# Patient Record
Sex: Male | Born: 1980 | Race: White | Hispanic: No | Smoking: Current every day smoker
Health system: Southern US, Community
[De-identification: ages and names within clinical notes are randomized; demographics above are authoritative.]

## PROBLEM LIST (undated history)

## (undated) DIAGNOSIS — F419 Anxiety disorder, unspecified: Secondary | ICD-10-CM

## (undated) HISTORY — PX: RHINOPLASTY: SUR1284

---

## 2010-11-15 ENCOUNTER — Emergency Department (HOSPITAL_COMMUNITY): Payer: BC Managed Care – PPO

## 2010-11-15 ENCOUNTER — Emergency Department (HOSPITAL_COMMUNITY)
Admission: EM | Admit: 2010-11-15 | Discharge: 2010-11-16 | Disposition: A | Discharge status: Home / Self Care | Payer: BC Managed Care – PPO | Attending: Emergency Medicine | Admitting: Emergency Medicine

## 2010-11-15 DIAGNOSIS — M545 Low back pain, unspecified: Secondary | ICD-10-CM | POA: Insufficient documentation

## 2010-11-15 DIAGNOSIS — R109 Unspecified abdominal pain: Secondary | ICD-10-CM | POA: Insufficient documentation

## 2010-11-15 LAB — CBC
HCT: 43.8 % (ref 39.0–52.0)
Hemoglobin: 15.5 g/dL (ref 13.0–17.0)
MCHC: 35.4 g/dL (ref 30.0–36.0)
RBC: 4.64 MIL/uL (ref 4.22–5.81)

## 2010-11-15 LAB — POCT I-STAT, CHEM 8
Chloride: 102 mEq/L (ref 96–112)
Glucose, Bld: 96 mg/dL (ref 70–99)
HCT: 47 % (ref 39.0–52.0)
Potassium: 3.6 mEq/L (ref 3.5–5.1)
Sodium: 138 mEq/L (ref 135–145)

## 2010-11-16 LAB — URINALYSIS, ROUTINE W REFLEX MICROSCOPIC
Bilirubin Urine: NEGATIVE
Glucose, UA: NEGATIVE mg/dL
Hgb urine dipstick: NEGATIVE
Ketones, ur: NEGATIVE mg/dL
Protein, ur: NEGATIVE mg/dL
pH: 6 (ref 5.0–8.0)

## 2010-11-16 LAB — HEPATIC FUNCTION PANEL
ALT: 24 U/L (ref 0–53)
AST: 26 U/L (ref 0–37)
Albumin: 3.9 g/dL (ref 3.5–5.2)
Alkaline Phosphatase: 142 U/L — ABNORMAL HIGH (ref 39–117)
Total Bilirubin: 0.2 mg/dL — ABNORMAL LOW (ref 0.3–1.2)
Total Protein: 6.8 g/dL (ref 6.0–8.3)

## 2012-03-15 IMAGING — CT CT ABD-PELV W/O CM
2 of 4 series · 17 of 46 positions shown, 19 images · non-contrast
Comparison: None.

CLINICAL DATA: Right flank pain and right lower quadrant pain.

CT ABDOMEN AND PELVIS WITHOUT CONTRAST
TECHNIQUE: Multidetector CT imaging of the abdomen and pelvis was
performed following the standard protocol without intravenous
contrast.

[Series 2: a/p w/o 5.0 b31f st · axial · non-contrast · 0.83mm/px · z∈[-490,-70]mm · 14 of 92 slices shown, 16 images]
[im 4/92  soft-tissue]
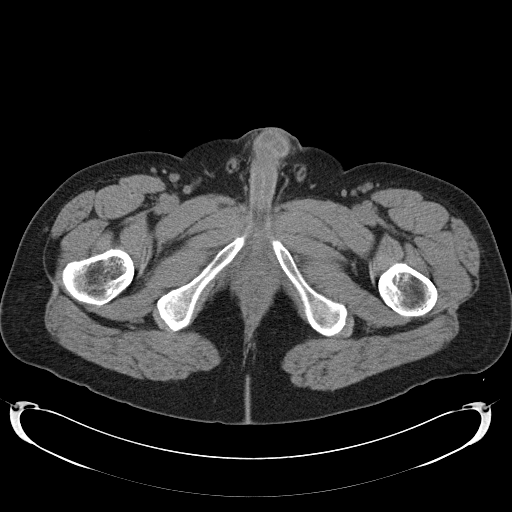
[im 4/92  bone]
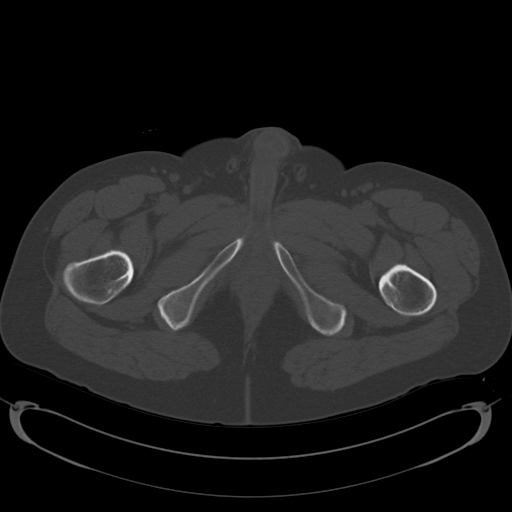
[im 12/92  soft-tissue]
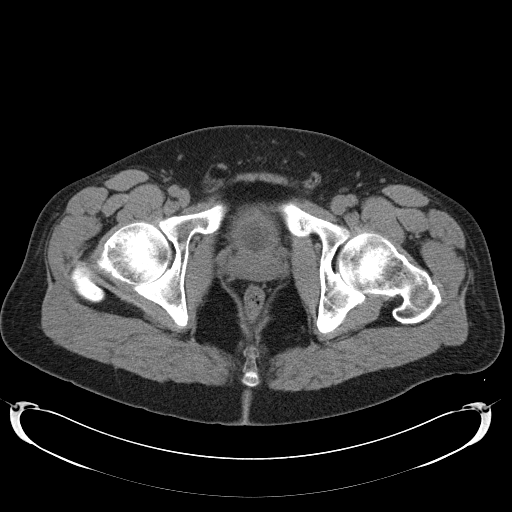
[im 19/92  soft-tissue]
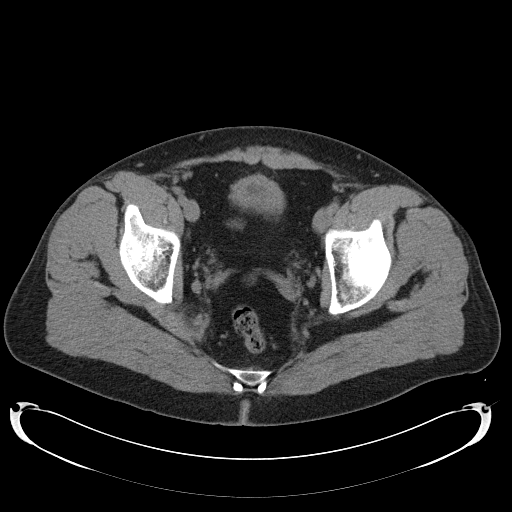
[im 23/92  soft-tissue]
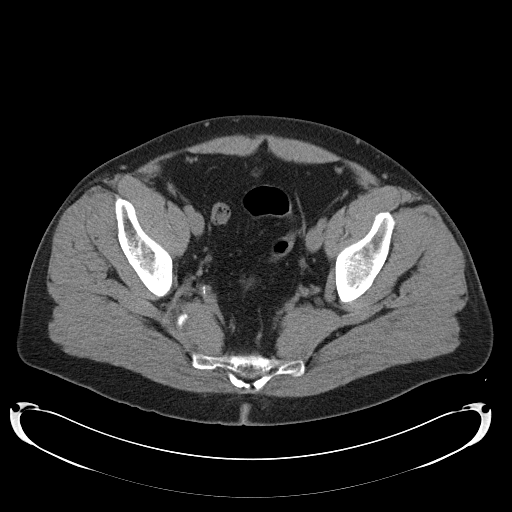
[im 31/92  soft-tissue]
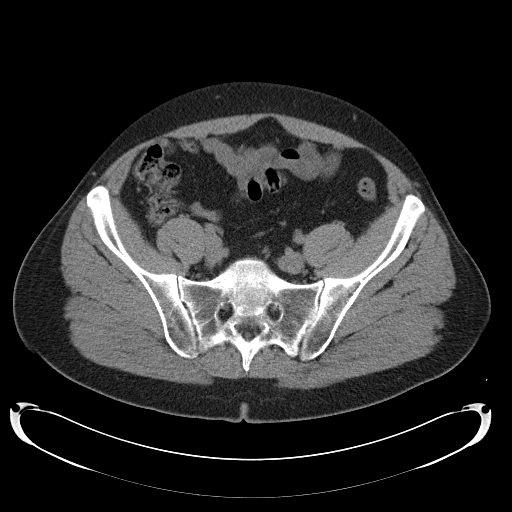
[im 38/92  soft-tissue]
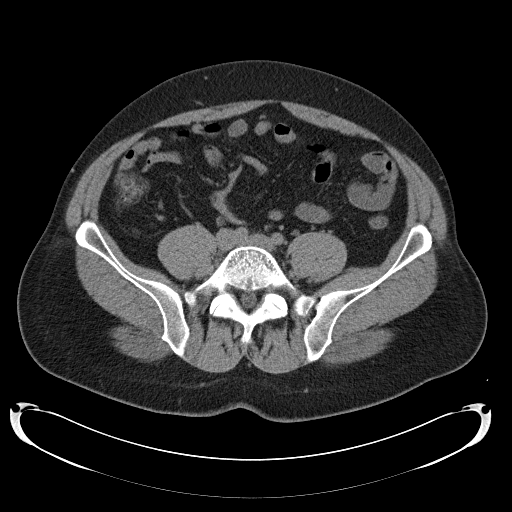
[im 42/92  soft-tissue]
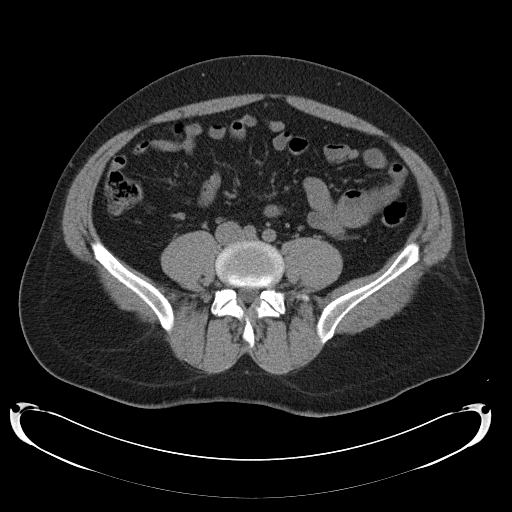
[im 50/92  soft-tissue]
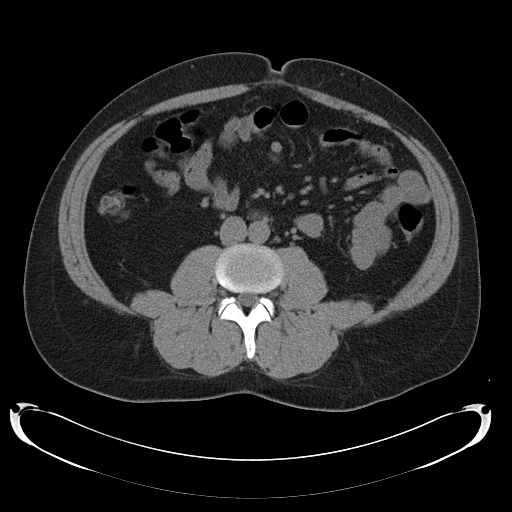
[im 54/92  soft-tissue]
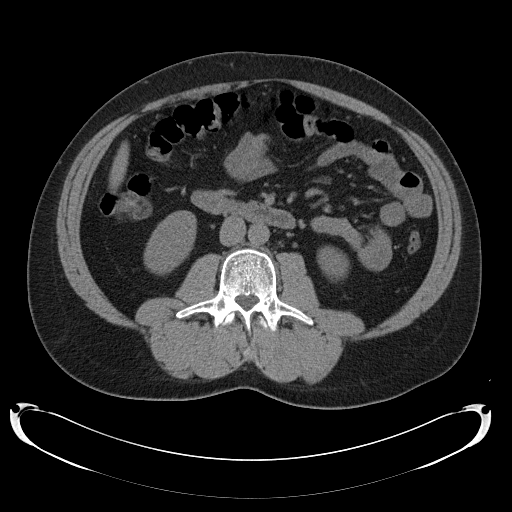
[im 54/92  bone]
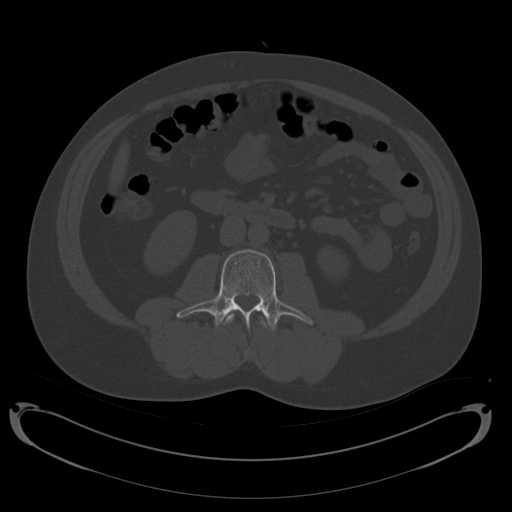
[im 61/92  soft-tissue]
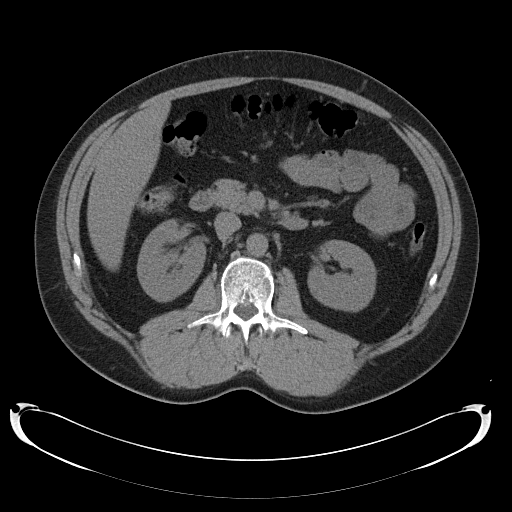
[im 69/92  soft-tissue]
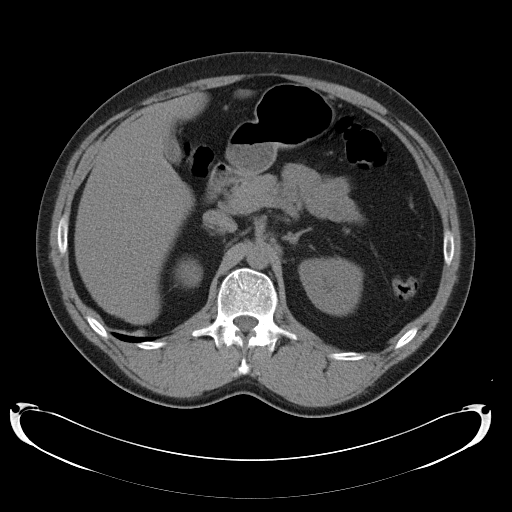
[im 73/92  soft-tissue]
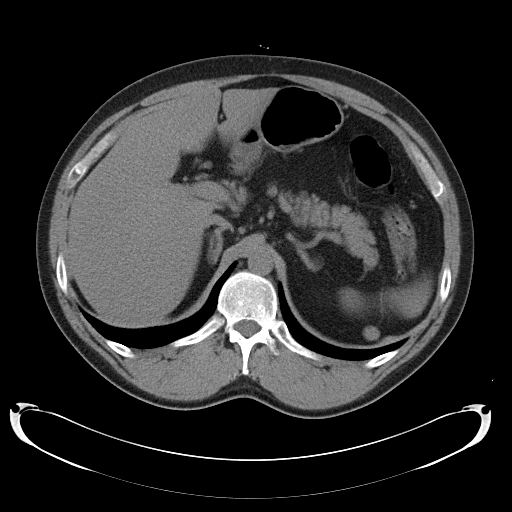
[im 80/92  soft-tissue]
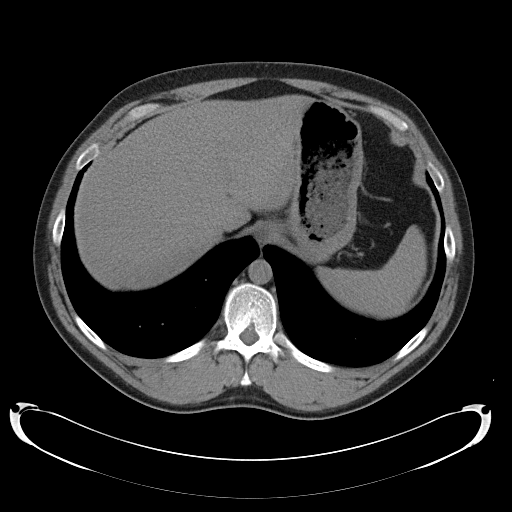
[im 88/92  soft-tissue]
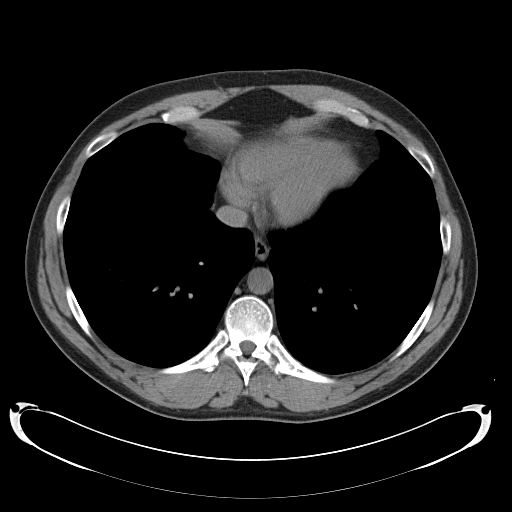

[Series 602: cor · coronal · 0.90mm/px · 3 of 133 slices shown]
[im 45/133  soft-tissue]
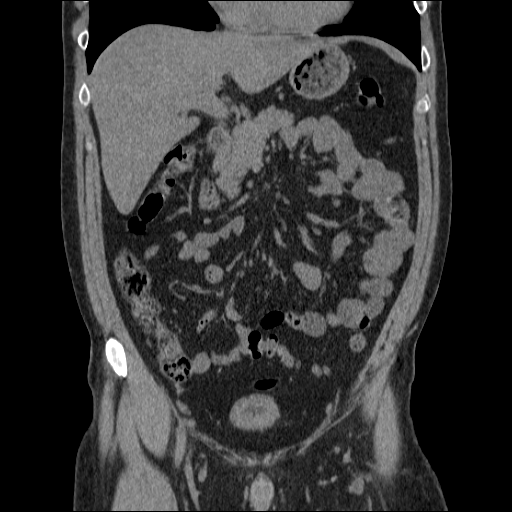
[im 59/133  soft-tissue]
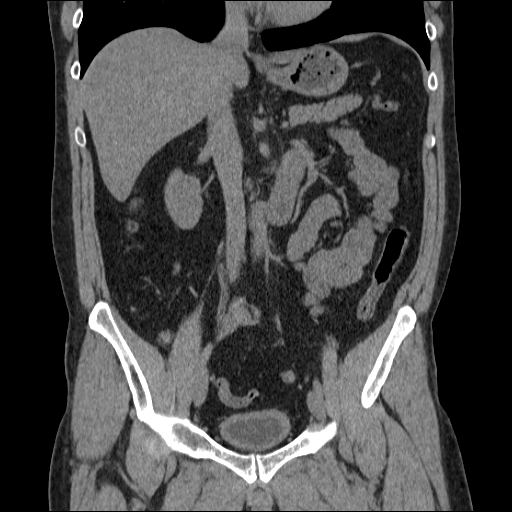
[im 74/133  soft-tissue]
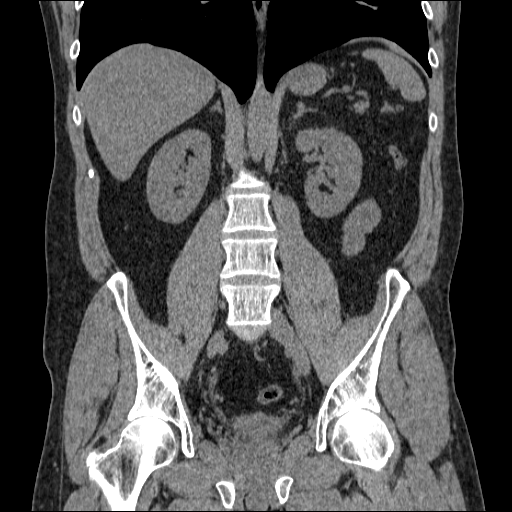

[17 of 46 positions shown; findings below may reference images not displayed]

FINDINGS: The lung bases are clear.  Small accessory spleen.  The
kidneys appear symmetrical.  No renal, ureteral, or bladder stones
demonstrated.  No pyelocaliectasis or ureterectasis.  The bladder
wall demonstrates mild diffuse thickening which might be due to
incomplete distension although bladder hypertrophy or infection are
not excluded.

Unenhanced liver, spleen, gallbladder, pancreas, adrenal glands,
abdominal aorta, and retroperitoneal lymph nodes are unremarkable.
The stomach and small bowel are decompressed.  No free air or free
fluid in the abdomen.

Pelvis:  The appendix is normal.  The prostate gland is not
enlarged.  No free or loculated pelvic fluid collections.  The
colon is decompressed without wall thickening or inflammatory
change.  Normal alignment of the lumbar vertebrae.
IMPRESSION: No renal or ureteral stone or obstruction demonstrated.  Diffuse
bladder wall thickening is nonspecific and might be due to under
distension although bladder hypertrophy or infection can also have
this appearance.

## 2013-03-06 ENCOUNTER — Encounter (HOSPITAL_COMMUNITY): Payer: Self-pay | Admitting: Emergency Medicine

## 2013-03-06 ENCOUNTER — Emergency Department (HOSPITAL_COMMUNITY)
Admission: EM | Admit: 2013-03-06 | Discharge: 2013-03-06 | Disposition: A | Discharge status: Home / Self Care | Payer: BC Managed Care – PPO | Attending: Emergency Medicine | Admitting: Emergency Medicine

## 2013-03-06 ENCOUNTER — Emergency Department (HOSPITAL_COMMUNITY): Payer: BC Managed Care – PPO

## 2013-03-06 DIAGNOSIS — Z7982 Long term (current) use of aspirin: Secondary | ICD-10-CM | POA: Insufficient documentation

## 2013-03-06 DIAGNOSIS — R0602 Shortness of breath: Secondary | ICD-10-CM | POA: Insufficient documentation

## 2013-03-06 DIAGNOSIS — Z8659 Personal history of other mental and behavioral disorders: Secondary | ICD-10-CM | POA: Insufficient documentation

## 2013-03-06 DIAGNOSIS — M25519 Pain in unspecified shoulder: Secondary | ICD-10-CM | POA: Insufficient documentation

## 2013-03-06 DIAGNOSIS — R079 Chest pain, unspecified: Secondary | ICD-10-CM

## 2013-03-06 DIAGNOSIS — F17200 Nicotine dependence, unspecified, uncomplicated: Secondary | ICD-10-CM | POA: Insufficient documentation

## 2013-03-06 DIAGNOSIS — R0789 Other chest pain: Secondary | ICD-10-CM | POA: Insufficient documentation

## 2013-03-06 HISTORY — DX: Anxiety disorder, unspecified: F41.9

## 2013-03-06 LAB — BASIC METABOLIC PANEL
BUN: 8 mg/dL (ref 6–23)
CO2: 28 mEq/L (ref 19–32)
Calcium: 10 mg/dL (ref 8.4–10.5)
Chloride: 96 mEq/L (ref 96–112)
Creatinine, Ser: 0.79 mg/dL (ref 0.50–1.35)
GFR calc Af Amer: >90 mL/min (ref >90)
GFR calc non Af Amer: >90 mL/min (ref >90)
Glucose, Bld: 91 mg/dL (ref 70–99)
Potassium: 4.2 mEq/L (ref 3.7–5.3)
Sodium: 138 mEq/L (ref 137–147)

## 2013-03-06 LAB — CBC
HCT: 48.3 % (ref 39.0–52.0)
Hemoglobin: 16.5 g/dL (ref 13.0–17.0)
MCH: 32.1 pg (ref 26.0–34.0)
MCHC: 34.2 g/dL (ref 30.0–36.0)
MCV: 94 fL (ref 78.0–100.0)
Platelets: 237 10*3/uL (ref 150–400)
RBC: 5.14 MIL/uL (ref 4.22–5.81)
RDW: 12.6 % (ref 11.5–15.5)
WBC: 7.3 10*3/uL (ref 4.0–10.5)

## 2013-03-06 LAB — POCT I-STAT TROPONIN I
Troponin i, poc: 0 ng/mL (ref 0.00–0.08)
Troponin i, poc: 0.01 ng/mL (ref 0.00–0.08)

## 2013-03-06 LAB — PRO B NATRIURETIC PEPTIDE: Pro B Natriuretic peptide (BNP): 20.6 pg/mL (ref 0–125)

## 2013-03-06 NOTE — ED Notes (Signed)
Pt transported to xray 

## 2013-03-06 NOTE — ED Provider Notes (Signed)
CSN: [id number]     Arrival date & time 03/06/13  1529 History   First MD Initiated Contact with Patient 03/06/13 1614     Chief Complaint  Patient presents with  . Chest Pain  . Shortness of Breath     (Consider location/radiation/quality/duration/timing/severity/associated sxs/prior Treatment) HPI Brent Collier is a 33 y.o. male who presents emergency department complaining of chest pain or shortness of breath. Patient states his symptoms began approximately 3 days ago. States he develops pressure in the left upper chest and left shoulder, that radiates into the left arm. She states she has mild associated shortness of breath. States this has been happening daily for the last 3 days, lasting anywhere from a few minutes to a few hours at a time. He states he just got promoted on his job a week ago. Prior to that his job is very physical, and states he has never had any similar symptoms. He states now he stopped exercising and doing any type of physical activity and thinks this is related to that. He admits to similar symptoms 5 years ago, states went to urgent care and he told him he had anxiety. At that time they started him on Xanax. Patient states he has been off of Xanax for approximately year and a half. He states he currently does not have a primary care Dr. He denies any prior creatinine history. He denies any family history of heart problems. He does not know his cholesterol, he is a smoker. He does not take any medications daily. He denies any other associated symptoms, denies any fever, chills, cough. He's currently symptom-free.  Past Medical History  Diagnosis Date  . Anxiety    Past Surgical History  Procedure Laterality Date  . Rhinoplasty     History reviewed. No pertinent family history. History  Substance Use Topics  . Smoking status: Current Every Day Smoker -- 0.50 packs/day  . Smokeless tobacco: Not on file  . Alcohol Use: 12.6 oz/week    21 Cans of beer per week     Review of Systems  Constitutional: Negative for fever and chills.  Respiratory: Positive for chest tightness and shortness of breath. Negative for cough.   Cardiovascular: Positive for chest pain. Negative for palpitations and leg swelling.  Gastrointestinal: Negative for nausea, vomiting, abdominal pain, diarrhea and abdominal distention.  Genitourinary: Negative for dysuria, urgency, frequency and hematuria.  Musculoskeletal: Negative for arthralgias, myalgias, neck pain and neck stiffness.  Skin: Negative for rash.  Allergic/Immunologic: Negative for immunocompromised state.  Neurological: Negative for dizziness, weakness, light-headedness, numbness and headaches.  All other systems reviewed and are negative.      Allergies  Bee venom  Home Medications   Current Outpatient Rx  Name  Route  Sig  Dispense  Refill  . aspirin EC 325 MG tablet   Oral   Take 325 mg by mouth daily as needed for mild pain (some chest pain).          BP 138/59  Pulse 76  Temp(Src) 98.3 F (36.8 C) (Oral)  Resp 14  SpO2 99% Physical Exam  Nursing note and vitals reviewed. Constitutional: He is oriented to person, place, and time. He appears well-developed and well-nourished. No distress.  Eyes: Conjunctivae are normal.  Cardiovascular: Normal rate, regular rhythm and normal heart sounds.   Pulmonary/Chest: Effort normal and breath sounds normal. No respiratory distress. He has no wheezes. He has no rales. He exhibits no tenderness.  Musculoskeletal: He exhibits no edema.  Neurological: He is alert and oriented to person, place, and time.  Skin: Skin is warm and dry.    ED Course  Procedures (including critical care time) Labs Review Labs Reviewed  CBC  BASIC METABOLIC PANEL  PRO B NATRIURETIC PEPTIDE  POCT I-STAT TROPONIN I  POCT I-STAT TROPONIN I   Imaging Review Dg Chest 2 View  03/06/2013   CLINICAL DATA:  Chest pain, shortness of breath  EXAM: CHEST  2 VIEW  COMPARISON:   None.  FINDINGS: The heart size and mediastinal contours are within normal limits. Both lungs are clear. The visualized skeletal structures are unremarkable.  IMPRESSION: No active cardiopulmonary disease.   Electronically Signed   By: Ruel Favors M.D.   On: 03/06/2013 17:04    EKG Interpretation   None       MDM   Final diagnoses:  Chest pain    Patient is with no risk factors for coronary artery disease, he is young, no medical problems, here with intermittent left-sided chest pain that is nonexertional. He is currently symptom-free. History of the same, diagnosed with anxiety. He has not taken increased anxiety at this time. We'll get labs, troponin, EKG, chest x-ray. Will monitor.   Patient's labs are all unremarkable, including delta troponin. He continues to be symptoms free. I discussed this with patient. He is anxious and concerned and wants to see a cardiologist outpatient. Referral provided. Also will need to find a PCP. Pt is to return if worsening symptoms.   Filed Vitals:   03/06/13 1541 03/06/13 1639 03/06/13 1810 03/06/13 1950  BP: 151/82 138/59 141/73 124/57  Pulse: 76   67  Temp: 98.3 F (36.8 C)   98 F (36.7 C)  TempSrc: Oral   Oral  Resp:  14 20 16   SpO2: 100% 99%  98%      Slevin Gunby A Senna Lape, PA-C 03/06/13 2257

## 2013-03-06 NOTE — Discharge Instructions (Signed)
Please follow up with a primary care doctor if symptoms continue. You can also follow up with cardiology.    Chest Pain (Nonspecific) It is often hard to give a specific diagnosis for the cause of chest pain. There is always a chance that your pain could be related to something serious, such as a heart attack or a blood clot in the lungs. You need to follow up with your caregiver for further evaluation. CAUSES   Heartburn.  Pneumonia or bronchitis.  Anxiety or stress.  Inflammation around your heart (pericarditis) or lung (pleuritis or pleurisy).  A blood clot in the lung.  A collapsed lung (pneumothorax). It can develop suddenly on its own (spontaneous pneumothorax) or from injury (trauma) to the chest.  Shingles infection (herpes zoster virus). The chest wall is composed of bones, muscles, and cartilage. Any of these can be the source of the pain.  The bones can be bruised by injury.  The muscles or cartilage can be strained by coughing or overwork.  The cartilage can be affected by inflammation and become sore (costochondritis). DIAGNOSIS  Lab tests or other studies, such as X-rays, electrocardiography, stress testing, or cardiac imaging, may be needed to find the cause of your pain.  TREATMENT   Treatment depends on what may be causing your chest pain. Treatment may include:  Acid blockers for heartburn.  Anti-inflammatory medicine.  Pain medicine for inflammatory conditions.  Antibiotics if an infection is present.  You may be advised to change lifestyle habits. This includes stopping smoking and avoiding alcohol, caffeine, and chocolate.  You may be advised to keep your head raised (elevated) when sleeping. This reduces the chance of acid going backward from your stomach into your esophagus.  Most of the time, nonspecific chest pain will improve within 2 to 3 days with rest and mild pain medicine. HOME CARE INSTRUCTIONS   If antibiotics were prescribed, take your  antibiotics as directed. Finish them even if you start to feel better.  For the next few days, avoid physical activities that bring on chest pain. Continue physical activities as directed.  Do not smoke.  Avoid drinking alcohol.  Only take over-the-counter or prescription medicine for pain, discomfort, or fever as directed by your caregiver.  Follow your caregiver's suggestions for further testing if your chest pain does not go away.  Keep any follow-up appointments you made. If you do not go to an appointment, you could develop lasting (chronic) problems with pain. If there is any problem keeping an appointment, you must call to reschedule. SEEK MEDICAL CARE IF:   You think you are having problems from the medicine you are taking. Read your medicine instructions carefully.  Your chest pain does not go away, even after treatment.  You develop a rash with blisters on your chest. SEEK IMMEDIATE MEDICAL CARE IF:   You have increased chest pain or pain that spreads to your arm, neck, jaw, back, or abdomen.  You develop shortness of breath, an increasing cough, or you are coughing up blood.  You have severe back or abdominal pain, feel nauseous, or vomit.  You develop severe weakness, fainting, or chills.  You have a fever. THIS IS AN EMERGENCY. Do not wait to see if the pain will go away. Get medical help at once. Call your local emergency services (911 in U.S.). Do not drive yourself to the hospital. MAKE SURE YOU:   Understand these instructions.  Will watch your condition.  Will get help right away if you  are not doing well or get worse. Document Released: 10/15/2004 Document Revised: 03/30/2011 Document Reviewed: 08/11/2007 [facility] Patient Information 2014 [place], [place].   Emergency Department Resource Guide 1) Find a Doctor and Pay Out of Pocket Although you won't have to find out who is covered by your insurance plan, it is a good idea to ask around and get  recommendations. You will then need to call the office and see if the doctor you have chosen will accept you as a new patient and what types of options they offer for patients who are self-pay. Some doctors offer discounts or will set up payment plans for their patients who do not have insurance, but you will need to ask so you aren't surprised when you get to your appointment.  2) Contact Your Local Health Department Not all health departments have doctors that can see patients for sick visits, but many do, so it is worth a call to see if yours does. If you don't know where your local health department is, you can check in your phone book. The CDC also has a tool to help you locate your state's health department, and many state websites also have listings of all of their local health departments.  3) Find a Walk-in Clinic If your illness is not likely to be very severe or complicated, you may want to try a walk in clinic. These are popping up all over the country in pharmacies, drugstores, and shopping centers. They're usually staffed by nurse practitioners or physician assistants that have been trained to treat common illnesses and complaints. They're usually fairly quick and inexpensive. However, if you have serious medical issues or chronic medical problems, these are probably not your best option.  No Primary Care Doctor: - Call Health Connect at  [phone] - they can help you locate a primary care doctor that  accepts your insurance, provides certain services, etc. - Physician Referral Service- [phone]  Chronic Pain Problems: Organization         Address  Phone   Notes  [facility]  [phone] Patients need to be referred by their primary care doctor.   Medication Assistance: Organization         Address  Phone   Notes  [facility] Medication [facility] [address] [place] [place], [place] [address] [phone] --Must be a resident of  [facility] -- Must have NO insurance coverage whatsoever (no Medicaid/ Medicare, etc.) -- The pt. MUST have a primary care doctor that directs their care regularly and follows them in the community   [facility]ist  [phone]   [employer]  [phone]    Agencies that provide inexpensive medical care: Organization         Address  Phone   Notes  [facility]  [phone]   [facility]    [phone]   [facility] [address] [place], [place] [address] [phone]   [facility] [address] [place] [place] [phone]   Planned Parenthood    [phone]   [facility]    [phone]   Community Health and [facility]  [address], [place] Phone:  [phone], Fax:  [phone] Hours of Operation:  9 am - 6 pm, M-F.  Also accepts Medicaid/Medicare and self-pay.  [facility]  [address], [place] Phone: [phone])  [phone], Fax: [phone]. Hours of Operation:  8:30 am - 5:30 pm, M-F.  Also accepts Medicaid and self-pay.  [facility] [place] [address], [place] Phone: [phone]) [phone]   [facility] [address], [place], [place] [phone], Ext. [phone] Mondays & Thursdays: 7-9 AM.  First 15 patients are seen on a first come, first serve basis.    Medicaid-accepting [facility] Providers:  Organization         Address  Phone   Notes  [facility] [address], [place] [phone] Also accepts self-pay patients.  [facility] [address], [place]  [phone]   [facility] [address], [place] [phone]   [facility] [address] [place] [address], [place] [phone]) [phone]   Renaye RakersVeita Bland [address], [place]   [phone] Only accepts [place] Access  [place]icaid patients after they have their name applied to their card.   Self-Pay (no insurance) in [facility] [facility]:  Organization         Address  Phone   Notes  Sickle Cell Patients, [facility] [address], [place] [phone]) [phone]   [facility] [address], [place] [phone]   [facility] [place]  [address], [place] [phone]   [facility]/Dr. Osei-Bonsu  [address] [place] [address], [place] or [phone], [place] [phone] Phone number for both [place] and [place] locations is the same.  [facility] [address], [place] [phone]   [facility]  [address] [place] [address], [place] or [address] [phone]) [phone]   [facility] [address], [place] [phone]) [phone], phone; [phone], fax Sees patients 1st and 3rd Saturday of every month.  Must not qualify for public or private insurance (i.e. Medicaid, Medicare, [place] Health Choice, Veterans' Benefits)  Household income should be no more than 200% of the poverty level The clinic cannot treat you if you are pregnant or think you are pregnant  Sexually transmitted diseases are not treated at the clinic.    Dental Care: Organization         Address  Phone  Notes  [facility] of [facility] [address], [place] [phone] Accepts children up to age 35 who are enrolled in [place] or [place] Health Choice; pregnant women with a Medicaid card; and children who have applied for Medicaid or [place] Health Choice, but were declined, whose parents can pay a reduced fee at time of service.  [facility] of [facility] [place]  [address], [place] [phone]) [phone] Accepts children up to age 35 who are enrolled in [place] or [place] Health Choice; pregnant women with a Medicaid  card; and children who have applied for Medicaid or [place] Health Choice, but were declined, whose parents can pay a reduced fee at time of service.  [facility]  [address], [place] [phone] Patients are seen by appointment only. Walk-ins are not accepted. [facility] will see patients 35 years of age and older. Monday - Tuesday (8am-5pm) Most Wednesdays (8:30-5pm) $30 per visit, cash only  [facility]  [address], [place] [phone] Patients are seen by appointment only. Walk-ins are not accepted. [facility] will see patients 85 years of age and older. One Wednesday Evening (Monthly: Volunteer Based).  $30 per visit, cash only  [employer]  [phone] for adults; Children under age 62, call Graduate Pediatric Dentistry at [phone]. Children aged 57-14, please call [phone] to request a pediatric application.  Dental services are provided in all areas of dental care including fillings, crowns and bridges, complete and partial dentures, implants, gum treatment, root canals, and extractions. Preventive care is also provided. Treatment is provided to both adults and children. Patients are selected via a lottery and there is often a waiting list.   [facility] [address], [place]  [phone] [email or web address]   [facility] [address] [place], [place] [phone], Ext. [phone] Second and Fourth Thursday of each month, opens at 6:30 AM; Clinic ends at 9 AM.  Patients are seen on a first-come first-served basis, and a limited number are seen during each clinic.   [facility]  [address] [place], [place] [phone]   Eligibility Requirements You must have lived in [place], [place], or [place] counties for at least the last three months.   You cannot be eligible for state or federal sponsored [place],  including [employer], [place], or [employer].   You generally cannot be eligible for healthcare insurance through your employer.    How to apply: Eligibility screenings are held every Tuesday and Wednesday afternoon from 1:00 pm until 4:00 pm. You do not need an appointment for the interview!  [facility] [address] [place], [place] [phone]   [facility] Health Department  [phone]   [facility] Health Department  [phone]   [facility] Health Department  [phone]    Behavioral Health Resources in the Community: Intensive Outpatient Programs Organization         Address  Phone  Notes  [facility] [address], [place], [place] [phone]   [facility] Outpatient [address], [place], [place] [phone]   [facility]: [facility] [address] [place], [place]  [phone]   [facility] Health [address]  [place], [place] [phone] or [phone]   Substance Abuse Resources Organization         Address  Phone  Notes  [facility] Drug [facility]  [phone]   [facility]  [phone]   [facility] [place]  [phone]   Floydene Flock  [phone]   Residential & Outpatient Substance Abuse Program  [phone]   Psychological Services Organization         Address  Phone  Notes  [facility]  [phone]   [facility]  [phone]   [facility] [address] [place] [phone] or [phone]    Mobile Crisis Teams Organization         Address  Phone  Notes  [facility], Mobile Crisis Care Unit  [phone]   [facility]  [address] [place], [place] [phone]   Doristine Locks [address] [place] [phone]    Self-Help/Support Groups Organization         Address  Phone             Notes  Mental Health Assoc.  of [place] - variety of support groups  [phone]- H3156881 Call for more information  Narcotics Anonymous (NA), [facility] [address], [employer] [place]  2 meetings at this location   Residential Facilities manager         Address  Phone  Notes  [facility] [place],    [place]  [phone]   [facility]  [address] [place] [phone], [place], [place]   [place], [place] [phone] Admissions: 8am-3pm M-F  Incentives Substance [place] [address],    [place], [place] [phone]   [facility] [address] [place], [place], [place]   [facility] [address]  [place], [place]   [facility]ight Programs - Intensive Outpatient [address], [place], [place]   [facility].,  [place], [place] [phone] or [phone]   [facility] ([facility]) [address] [place], [place] Accepts Medicaid  [facility] [place] [address],  [place] [phone] Substance Abuse/Addiction Treatment   [facility] Organization         Address  Phone  Notes  [facility]  [phone]   Domenic Schwab, PhD [address]lis Porta [place], [place]   [phone] or [phone]   [place] [facility] [place], [place] [phone]   [facility] [address] [place], [place] [phone] Insurance/Medicaid/sponsorship through [facility] and Families [address]                                    [place], [place] [phone] [place] [address], [place] [phone]    Dr. Adele Schilder  [phone]   [facility]. 1) [address], [place] 2)  [place] 3)  [address], [place] [phone]  [phone]   [place] [phone] or [phone] (After Hours)

## 2013-03-06 NOTE — ED Notes (Signed)
Pt states that he has had CP/pressure and L arm pain x3 days accompanied by SOB. States that he had a similar feeling 5 years ago attributed to anxiety. Sent by PCP who stated his EKG was normal but sent him in for further evaluation. No acute distress in triage. Alert and Oriented.

## 2013-03-06 NOTE — Progress Notes (Signed)
   CARE MANAGEMENT ED NOTE 03/06/2013  Patient:  Brent Collier,Brent Collier   Account Number:  [id number]  Date Initiated:  03/06/2013  Documentation initiated by:  Edd ArbourGIBBS,Will Schier  Subjective/Objective Assessment:   33 yr old bcbs [place] ppo CP/pressure and L arm pain x3 days accompanied by SOB. States that he had a similar feeling 5 years ago attributed to anxiety confimred with ED CM he does not have a pcp     Subjective/Objective Assessment Detail:     Action/Plan:   CM discussed importance of pcp for f/u care CM provided pt with a 6 page list of pcps within zip code [phone] in network for bcbs  ppo options plan   Action/Plan Detail:   Anticipated DC Date:  03/06/2013     Status Recommendation to Physician:   Result of Recommendation:    Other ED Services  Consult Working Plan    DC Planning Services  Other  PCP issues  Outpatient Services - Pt will follow up    Choice offered to / List presented to:            Status of service:  Completed, signed off  ED Comments:   ED Comments Detail:

## 2013-03-06 NOTE — ED Notes (Signed)
Patient states that he feels better now but has been told in the past that his chest pain was attributed to anxiety. Patient is rating pain at 1.

## 2013-03-07 NOTE — ED Provider Notes (Signed)
Medical screening examination/treatment/procedure(s) were performed by non-physician practitioner and as supervising physician I was immediately available for consultation/collaboration.  EKG Interpretation   None        Chariah Bailey, MD 03/07/13 1324 

## 2014-07-05 IMAGING — CR DG CHEST 2V
2 series · 2 of 2 positions shown · non-contrast
Comparison: None.

CLINICAL DATA: Chest pain, shortness of breath

EXAM:
CHEST  2 VIEW

[w chest pa]
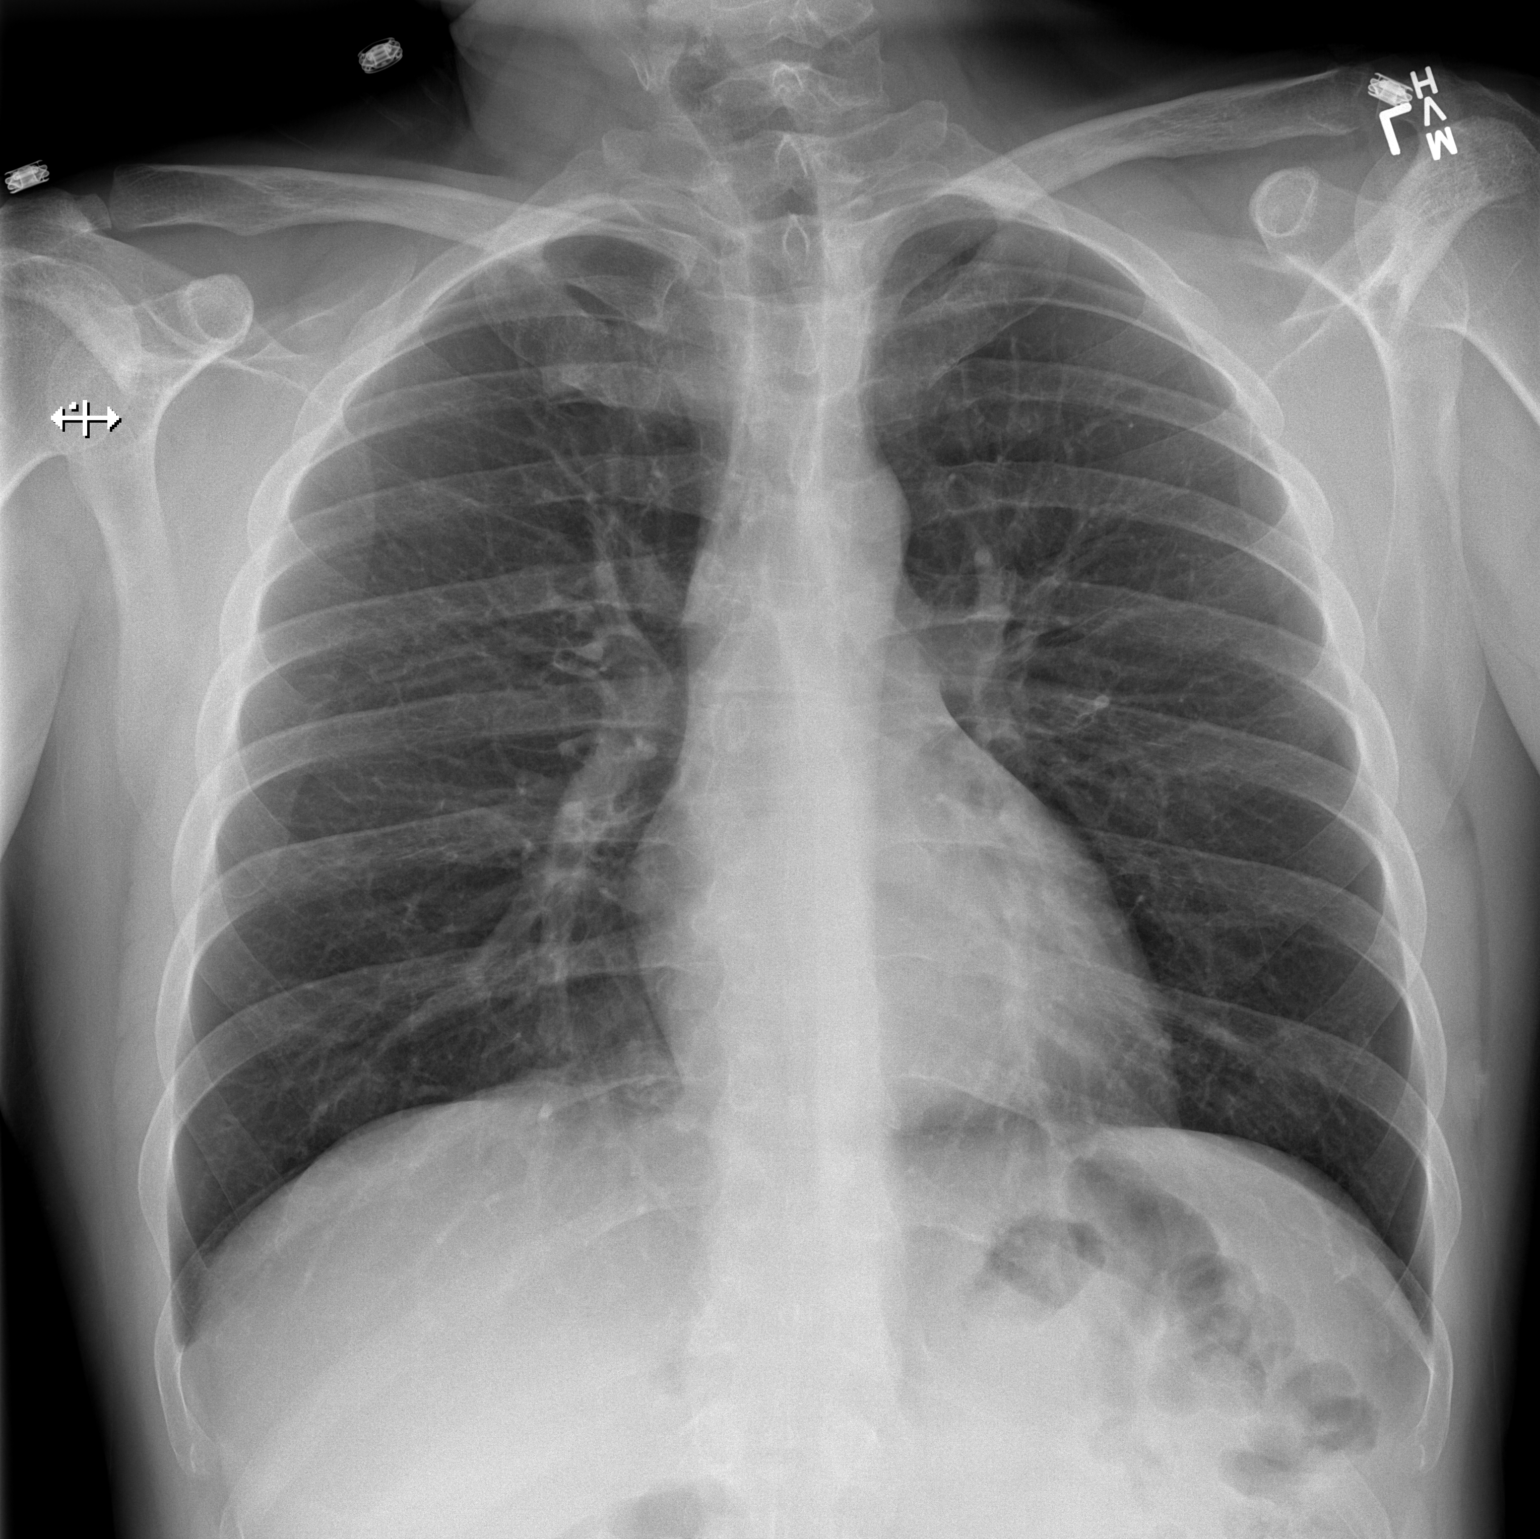

[w chest lat]
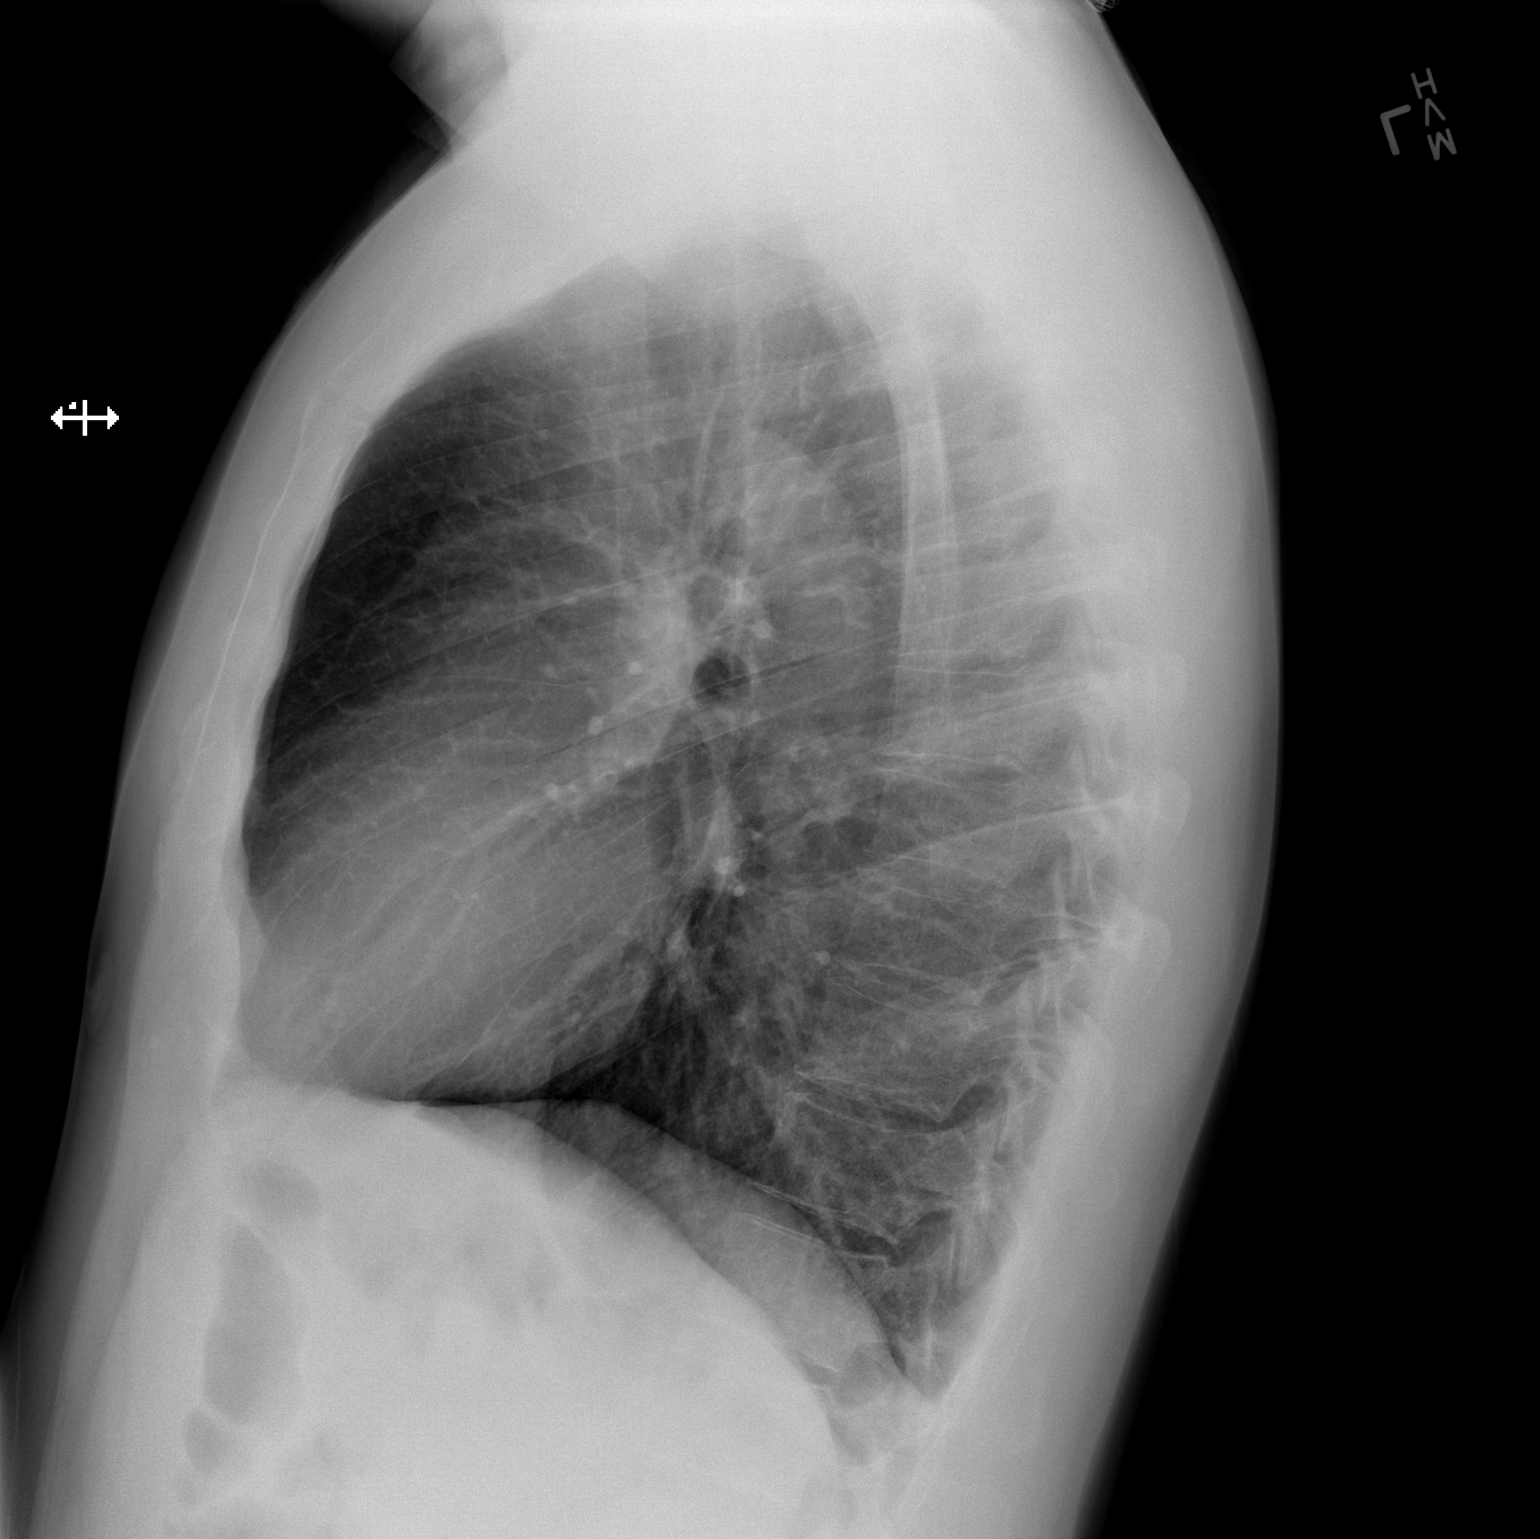

[2 of 2 positions shown; findings below may reference images not displayed]

FINDINGS: The heart size and mediastinal contours are within normal limits.
Both lungs are clear. The visualized skeletal structures are
unremarkable.
IMPRESSION: No active cardiopulmonary disease.
